# Patient Record
Sex: Male | Born: 2005 | Race: White | Hispanic: Yes | Marital: Single | Smoking: Never smoker
Health system: Southern US, Community
[De-identification: ages and names within clinical notes are randomized; demographics above are authoritative.]

---
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
## 2023-06-01 IMAGING — CT CT HEAD W/O CM
4 series · 17 of 47 positions shown, 19 images · non-contrast
Comparison: None.

CLINICAL DATA: Whole body tremors.



[Series 3: head wo · axial · 0.39mm/px · z∈[-88,+37]mm · 7 of 35 slices shown, 9 images]
[im 5/35  brain]
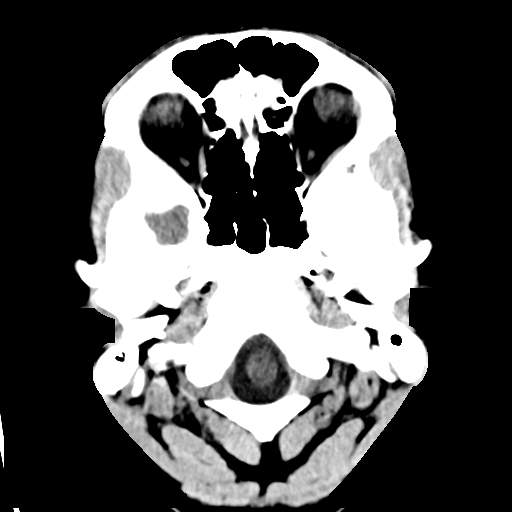
[im 5/35  bone]
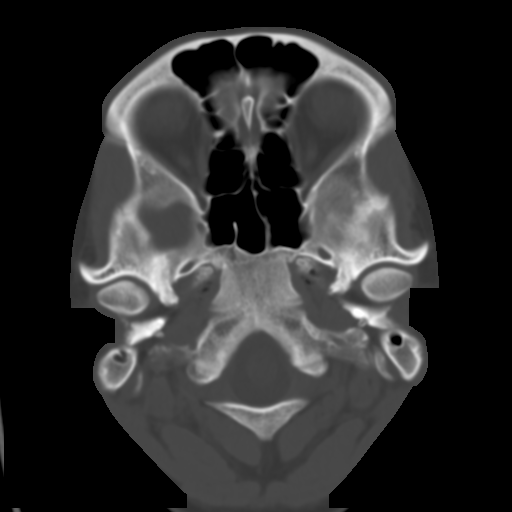
[im 9/35  brain]
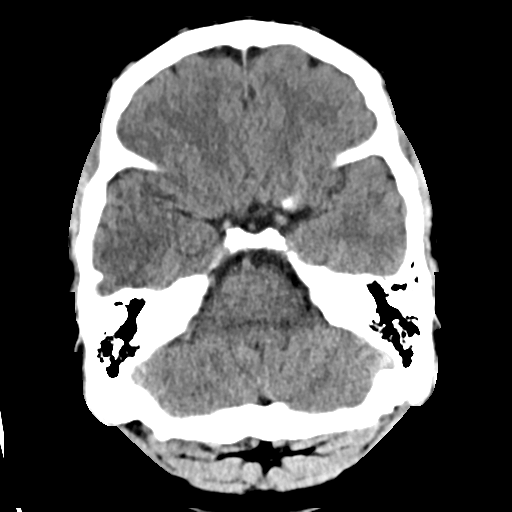
[im 13/35  brain]
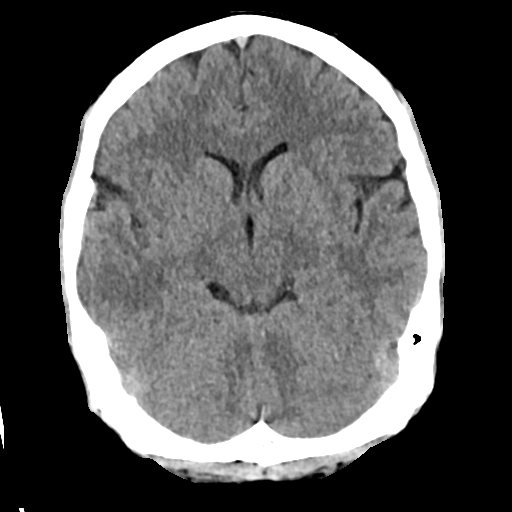
[im 18/35  brain]
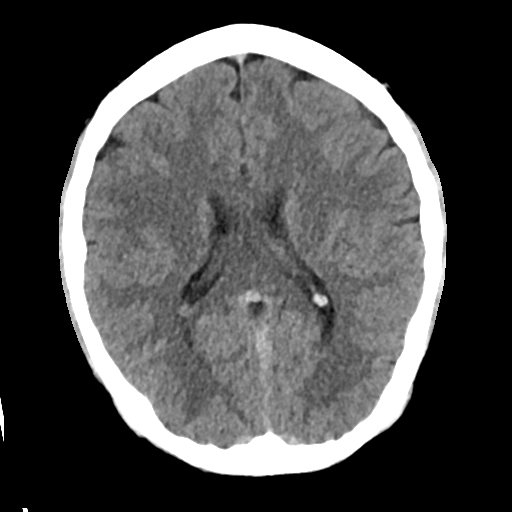
[im 22/35  brain]
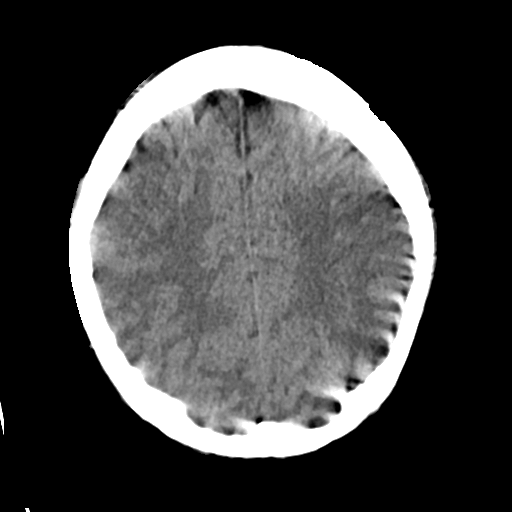
[im 22/35  bone]
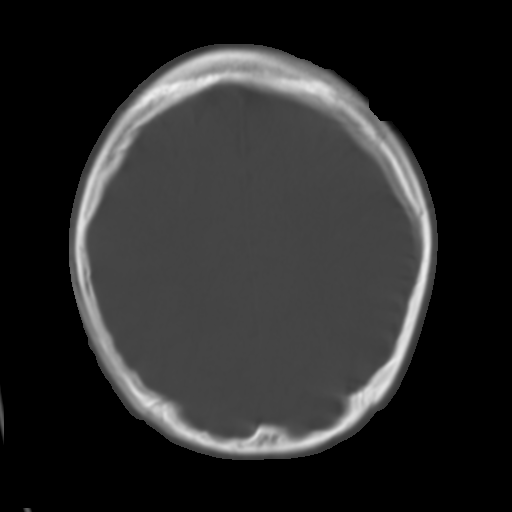
[im 26/35  brain]
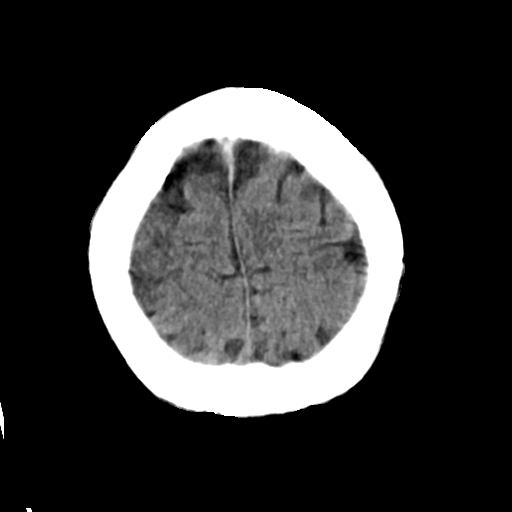
[im 30/35  brain]
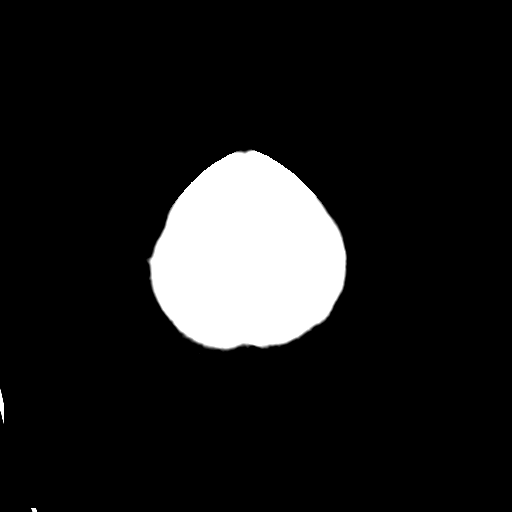

[Series 4: head bone · axial · 0.39mm/px · z∈[-92,-32]mm · 4 of 86 slices shown]
[im 9/86  bone]
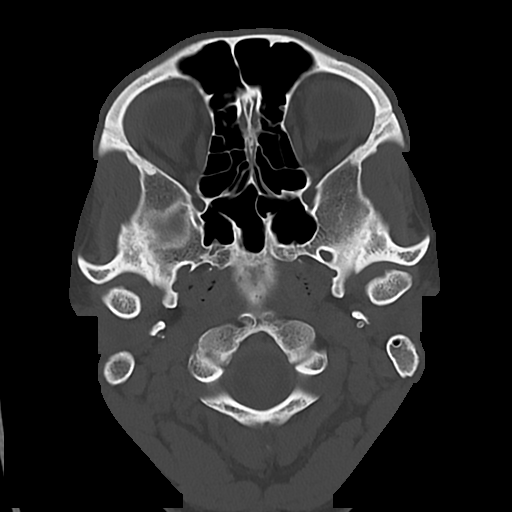
[im 18/86  bone]
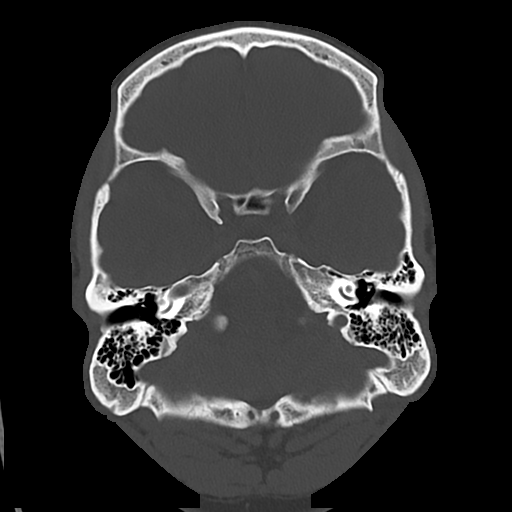
[im 26/86  bone]
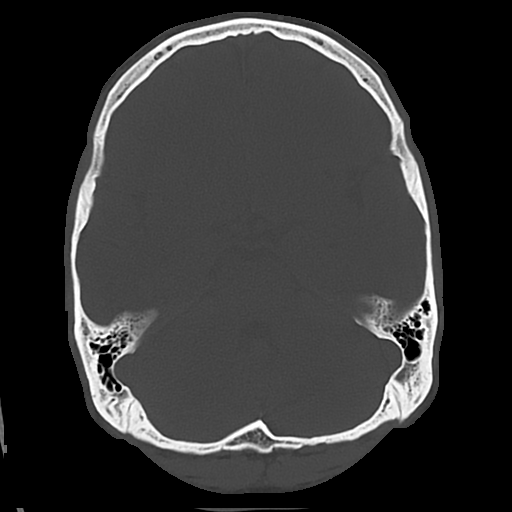
[im 39/86  bone]
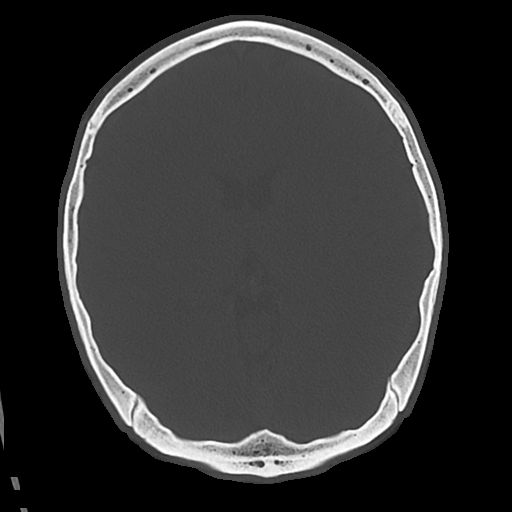

[Series 5: cor soft · coronal · 0.36mm/px · 3 of 68 slices shown]
[im 23/68  brain]
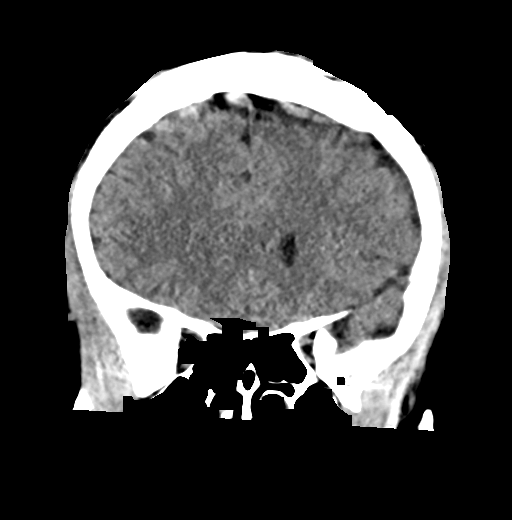
[im 30/68  brain]
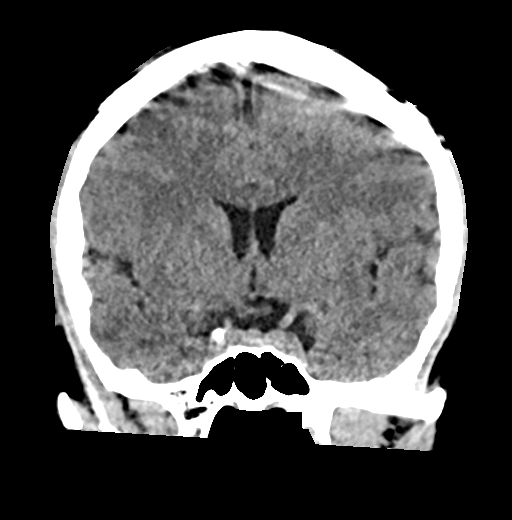
[im 38/68  brain]
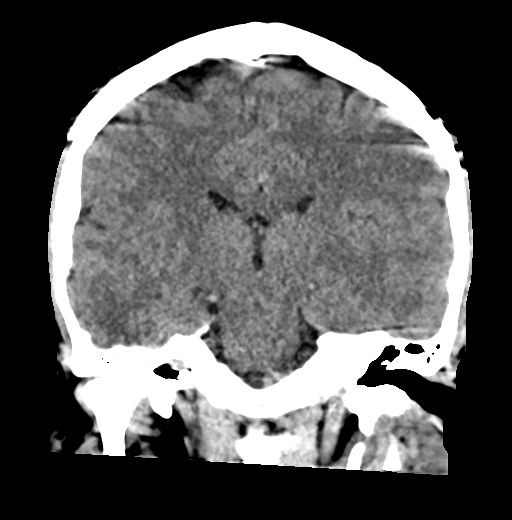

[Series 6: sag soft · sagittal · 0.40mm/px · 3 of 60 slices shown]
[im 20/60  brain]
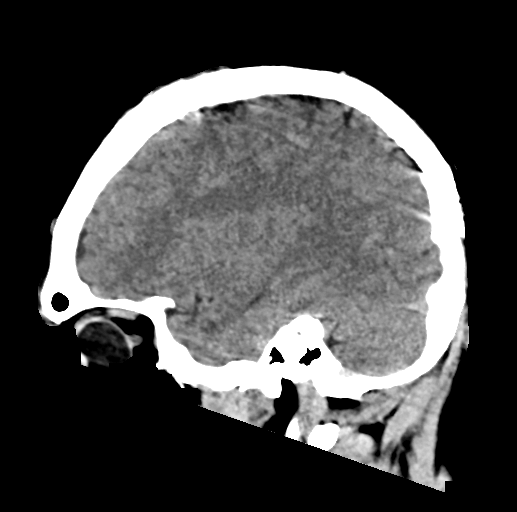
[im 30/60  brain]
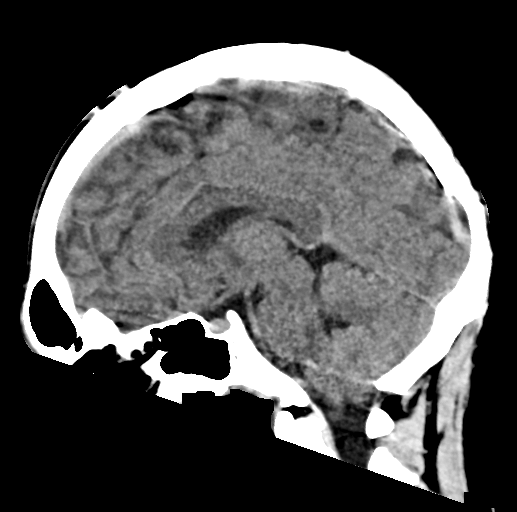
[im 40/60  brain]
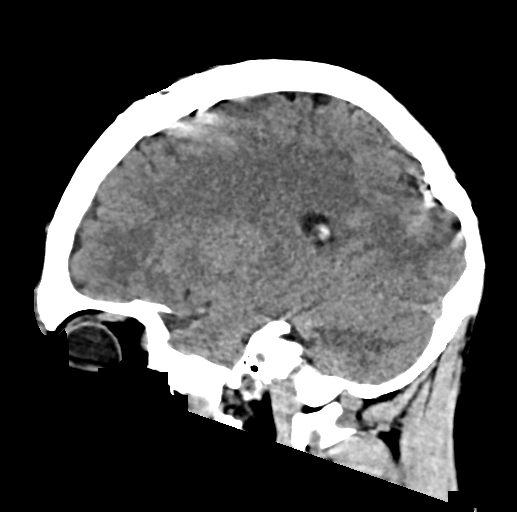

[17 of 47 positions shown; findings below may reference images not displayed]

FINDINGS: Brain: No evidence of acute infarction, hemorrhage, hydrocephalus,
extra-axial collection or mass lesion/mass effect.

Vascular: No hyperdense vessel or unexpected calcification.

Skull: Normal. Negative for fracture or focal lesion.

Sinuses/Orbits: No acute finding.

Other: The study is limited secondary to patient motion.
IMPRESSION: No acute intracranial pathology.
